# Patient Record
Sex: Female | Born: 1965 | Race: White | Hispanic: Yes | Marital: Married
Health system: Southern US, Community
[De-identification: ages and names within clinical notes are randomized; demographics above are authoritative.]

---

## 2004-01-09 ENCOUNTER — Ambulatory Visit: Payer: Self-pay | Admitting: Internal Medicine

## 2004-01-21 ENCOUNTER — Ambulatory Visit: Payer: Self-pay | Admitting: Internal Medicine

## 2004-01-22 ENCOUNTER — Ambulatory Visit: Payer: Self-pay | Admitting: *Deleted

## 2004-02-05 ENCOUNTER — Ambulatory Visit: Payer: Self-pay | Admitting: Internal Medicine

## 2004-02-18 ENCOUNTER — Ambulatory Visit: Payer: Self-pay | Admitting: Internal Medicine

## 2004-02-26 ENCOUNTER — Ambulatory Visit: Payer: Self-pay | Admitting: Internal Medicine

## 2007-03-15 ENCOUNTER — Encounter (INDEPENDENT_AMBULATORY_CARE_PROVIDER_SITE_OTHER): Payer: Self-pay | Admitting: Family Medicine

## 2007-03-15 ENCOUNTER — Ambulatory Visit: Payer: Self-pay | Admitting: Family Medicine

## 2007-03-15 LAB — CONVERTED CEMR LAB
Antibody Screen: NEGATIVE
Eosinophils Absolute: 0 10*3/uL — ABNORMAL LOW (ref 0.2–0.7)
Hepatitis B Surface Ag: NEGATIVE
Lymphocytes Relative: 18 % (ref 12–46)
Lymphs Abs: 1.2 10*3/uL (ref 0.7–4.0)
MCV: 87.2 fL (ref 78.0–100.0)
Monocytes Relative: 7 % (ref 3–12)
Neutrophils Relative %: 74 % (ref 43–77)
Platelets: 242 10*3/uL (ref 150–400)
RBC: 4.54 M/uL (ref 3.87–5.11)
Rh Type: POSITIVE
Rubella: 301.1 intl units/mL — ABNORMAL HIGH
WBC: 6.9 10*3/uL (ref 4.0–10.5)

## 2007-03-16 ENCOUNTER — Encounter (INDEPENDENT_AMBULATORY_CARE_PROVIDER_SITE_OTHER): Payer: Self-pay | Admitting: Family Medicine

## 2007-03-21 ENCOUNTER — Encounter (INDEPENDENT_AMBULATORY_CARE_PROVIDER_SITE_OTHER): Payer: Self-pay | Admitting: Family Medicine

## 2007-03-21 ENCOUNTER — Encounter: Payer: Self-pay | Admitting: Family Medicine

## 2007-03-21 ENCOUNTER — Ambulatory Visit: Payer: Self-pay | Admitting: Family Medicine

## 2007-04-03 ENCOUNTER — Telehealth (INDEPENDENT_AMBULATORY_CARE_PROVIDER_SITE_OTHER): Payer: Self-pay | Admitting: Family Medicine

## 2007-04-16 ENCOUNTER — Encounter (INDEPENDENT_AMBULATORY_CARE_PROVIDER_SITE_OTHER): Payer: Self-pay | Admitting: Family Medicine

## 2007-05-07 ENCOUNTER — Encounter: Payer: Self-pay | Admitting: Family Medicine

## 2007-05-07 ENCOUNTER — Encounter (INDEPENDENT_AMBULATORY_CARE_PROVIDER_SITE_OTHER): Payer: Self-pay | Admitting: *Deleted

## 2007-05-07 ENCOUNTER — Ambulatory Visit: Payer: Self-pay | Admitting: Family Medicine

## 2007-05-07 DIAGNOSIS — O09529 Supervision of elderly multigravida, unspecified trimester: Secondary | ICD-10-CM | POA: Insufficient documentation

## 2007-05-07 LAB — CONVERTED CEMR LAB
Bilirubin Urine: NEGATIVE
Glucose, Urine, Semiquant: NEGATIVE
Nitrite: NEGATIVE
Protein, U semiquant: 30
Specific Gravity, Urine: 1.015
Whiff Test: NEGATIVE
pH: 8.5

## 2007-05-14 ENCOUNTER — Telehealth: Payer: Self-pay | Admitting: *Deleted

## 2007-05-14 ENCOUNTER — Encounter (INDEPENDENT_AMBULATORY_CARE_PROVIDER_SITE_OTHER): Payer: Self-pay | Admitting: Family Medicine

## 2007-05-15 ENCOUNTER — Telehealth: Payer: Self-pay | Admitting: Family Medicine

## 2007-05-15 ENCOUNTER — Encounter: Payer: Self-pay | Admitting: Family Medicine

## 2007-05-25 ENCOUNTER — Ambulatory Visit (HOSPITAL_COMMUNITY): Admission: RE | Admit: 2007-05-25 | Discharge: 2007-05-25 | Payer: Self-pay | Admitting: Family Medicine

## 2007-05-25 ENCOUNTER — Encounter (INDEPENDENT_AMBULATORY_CARE_PROVIDER_SITE_OTHER): Payer: Self-pay | Admitting: Family Medicine

## 2007-05-30 ENCOUNTER — Ambulatory Visit: Payer: Self-pay | Admitting: Family Medicine

## 2007-05-30 ENCOUNTER — Encounter: Payer: Self-pay | Admitting: Family Medicine

## 2007-06-06 ENCOUNTER — Encounter: Payer: Self-pay | Admitting: Family Medicine

## 2007-06-06 ENCOUNTER — Ambulatory Visit: Payer: Self-pay | Admitting: Family Medicine

## 2007-06-06 LAB — CONVERTED CEMR LAB: Glucose, Urine, Semiquant: NEGATIVE

## 2007-07-03 ENCOUNTER — Ambulatory Visit: Payer: Self-pay | Admitting: Family Medicine

## 2007-07-03 LAB — CONVERTED CEMR LAB

## 2007-07-18 ENCOUNTER — Encounter (INDEPENDENT_AMBULATORY_CARE_PROVIDER_SITE_OTHER): Payer: Self-pay | Admitting: Family Medicine

## 2007-07-18 ENCOUNTER — Ambulatory Visit: Payer: Self-pay | Admitting: Family Medicine

## 2007-07-18 LAB — CONVERTED CEMR LAB: Protein, U semiquant: NEGATIVE

## 2007-07-26 DIAGNOSIS — D649 Anemia, unspecified: Secondary | ICD-10-CM

## 2007-08-07 ENCOUNTER — Ambulatory Visit: Payer: Self-pay | Admitting: Family Medicine

## 2007-08-07 ENCOUNTER — Encounter (INDEPENDENT_AMBULATORY_CARE_PROVIDER_SITE_OTHER): Payer: Self-pay | Admitting: Family Medicine

## 2007-08-07 LAB — CONVERTED CEMR LAB
Bilirubin Urine: NEGATIVE
Glucose, Urine, Semiquant: NEGATIVE
Ketones, urine, test strip: NEGATIVE
Specific Gravity, Urine: 1.025
Urobilinogen, UA: 0.2

## 2007-08-08 ENCOUNTER — Encounter (INDEPENDENT_AMBULATORY_CARE_PROVIDER_SITE_OTHER): Payer: Self-pay | Admitting: Family Medicine

## 2007-08-23 ENCOUNTER — Ambulatory Visit: Payer: Self-pay | Admitting: Family Medicine

## 2007-08-23 ENCOUNTER — Encounter: Payer: Self-pay | Admitting: Family Medicine

## 2007-08-23 LAB — CONVERTED CEMR LAB
Glucose, Urine, Semiquant: NEGATIVE
Protein, U semiquant: NEGATIVE

## 2007-09-06 ENCOUNTER — Encounter: Payer: Self-pay | Admitting: Family Medicine

## 2007-09-06 ENCOUNTER — Ambulatory Visit: Payer: Self-pay | Admitting: Family Medicine

## 2007-09-06 LAB — CONVERTED CEMR LAB: Strep B culture: NEGATIVE

## 2007-09-18 ENCOUNTER — Ambulatory Visit: Payer: Self-pay | Admitting: Family Medicine

## 2007-09-27 ENCOUNTER — Ambulatory Visit: Payer: Self-pay | Admitting: Family Medicine

## 2007-09-28 ENCOUNTER — Encounter: Payer: Self-pay | Admitting: Family Medicine

## 2007-10-05 ENCOUNTER — Encounter: Payer: Self-pay | Admitting: Family Medicine

## 2007-10-05 ENCOUNTER — Ambulatory Visit: Payer: Self-pay | Admitting: Family Medicine

## 2007-10-05 LAB — CONVERTED CEMR LAB

## 2007-10-09 ENCOUNTER — Ambulatory Visit: Payer: Self-pay | Admitting: Family Medicine

## 2007-10-09 ENCOUNTER — Encounter: Payer: Self-pay | Admitting: Family Medicine

## 2007-10-09 LAB — CONVERTED CEMR LAB: Glucose, Urine, Semiquant: NEGATIVE

## 2007-10-14 ENCOUNTER — Ambulatory Visit: Payer: Self-pay | Admitting: Obstetrics & Gynecology

## 2007-10-14 ENCOUNTER — Inpatient Hospital Stay (HOSPITAL_COMMUNITY): Admission: AD | Admit: 2007-10-14 | Discharge: 2007-10-18 | Payer: Self-pay | Admitting: Gynecology

## 2007-10-15 ENCOUNTER — Encounter: Payer: Self-pay | Admitting: Obstetrics & Gynecology

## 2007-10-15 ENCOUNTER — Ambulatory Visit: Payer: Self-pay | Admitting: Family Medicine

## 2007-11-05 ENCOUNTER — Telehealth (INDEPENDENT_AMBULATORY_CARE_PROVIDER_SITE_OTHER): Payer: Self-pay | Admitting: Family Medicine

## 2010-05-02 ENCOUNTER — Encounter: Payer: Self-pay | Admitting: Family Medicine

## 2010-08-24 NOTE — Op Note (Signed)
NAME:  Erin Vargas, Erin Vargas  ACCOUNT NO.:  [id number]   MEDICAL RECORD NO.:  [id number]          PATIENT TYPE:  INP   LOCATION:  9106                          FACILITY:  [facility]   PHYSICIAN:  Norton Blizzard, MD    DATE OF BIRTH:  [birth year]   DATE OF PROCEDURE:  10/15/2007  DATE OF DISCHARGE:                               OPERATIVE REPORT   PREOPERATIVE DIAGNOSES:  1. Intrauterine pregnancy at 41 weeks' gestation.  2. Failed induction of labor for post dates.  3. Fetal malposition.  4. Persistent category 2 fetal heart rate tracing.   POSTOPERATIVE DIAGNOSES:  1. Intrauterine pregnancy at 41 weeks' gestation.  2. Failed induction of labor for post dates.  3. Fetal malposition.  4. Persistent category 2 fetal heart rate tracing.   PROCEDURE:  Primary low transverse cesarean section via Pfannenstiel  incision.   SURGEON:  Norton Blizzard, MD   ASSISTANT:  Alanda Amass, MD   ANESTHESIA:  Epidural.   IV FLUIDS:  1400 mL of lactated Ringer's.   ESTIMATED BLOOD LOSS:  700 mL.   URINE OUTPUT:  75 mL of clear yellow urine.   INDICATIONS:  The patient is a 46 year old G1, P0 who was undergoing  induction of labor for postdates at 34 weeks' gestation.  The patient's  induction was started and she progressed to 5 cm while on Pitocin.  The  patient was noted to have persistent category 2 fetal heart rate tracing  which was characterized by minimal variability at times and repetitive  variable decels.  The patient's position was turned and she was given  oxygen and her tracing did not get better.  At some point, there was the  usage of scalp stimulation for reassurance.  However, the patient was  noted never to have adequate contractions and never changed her cervical  dilatation from 5 cm.  The fetal head was noted to be asynclitic and was  developing significant caput.  Given of these factors, the patient was  given the option to proceed with a cesarean section.  The risks  of this  procedure were discussed with the patient using a Spanish interpreter  and written informed consent was obtained.   FINDINGS:  Viable female infant in cephalic, asynclitic position.  Apgars  were 8 and 9, weight 8 pounds 7 ounces.  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  Spontaneous placental delivery intact with three-  vessel cord.  Normal uterus, tubes, and ovaries bilaterally.   SPECIMENS:  Placenta which was sent to pathology.  Venous cord pH was  7.17.   COMPLICATIONS:  None.   PROCEDURE DETAILS:  The patient was taken to the operating room where  her epidural anesthesia was dosed up to a surgical level and found to be  adequate.  She was then placed in the dorsal supine position with a  leftward tilt, and prepped and draped in a sterile manner.  The fascial  incision was made with the scalpel and carried through to the underlying  layer of fascia.  The fascia was incised in the midline and the incision  was extended bilaterally using the Mayo scissors.  Kochers were  applied  to the superior aspect of the incision, and the rectus muscle was  dissected off bluntly and sharply.  A similar process was carried out on  the inferior aspect of the incision.  The rectus muscles were separated  in the midline bluntly and the peritoneum was entered bluntly.  This  incision was extended with good visualization of bowel and bladder.  Attention was turned to the lower uterine segment where a bladder flap  was created and a transverse hysterotomy was made using the scalpel and  extended in a blunt fashion.  The infant at this point was encountered  and was delivered atraumatically.  The cord was clamped and cut, and the  infant was handed over to the awaiting pediatricians.  Of note, on gross  examination, the infant was noted to have an absent right forearm and  hand.  This was previously noted on anatomy ultrasound scan that was  done at 18 weeks' gestation.  Fundal massage  was then administered, and  placenta delivered intact with three-vessel cord.  The uterus was  exteriorized and cleared of all clot and debris.  The hysterotomy was  repaired using 0 Monocryl in a running locked fashion.  A second layer  of the same suture was used as an imbricating layer.  Overall, good  hemostasis was noted.  The uterus was returned to the abdomen.  The  pelvis was cleared of all clot and debris.  The peritoneum was  reapproximated using two interrupted stitches of 0 Monocryl, the fascia  was reapproximated using 0 Vicryl in a running stitch, and the skin was  closed with staples.  The patient tolerated the procedure well.  Sponge,  instrument, and needle counts were correct x3.  She was taken to the  recovery room awake and in a stable condition.      Norton Blizzard, MD  Electronically Signed     UAD/MEDQ  D:  10/15/2007  T:  10/15/2007  Job:  [id number]

## 2010-08-27 NOTE — Discharge Summary (Signed)
NAME:  Erin Vargas, Erin Vargas  ACCOUNT NO.:  [id number]   MEDICAL RECORD NO.:  [id number]          PATIENT TYPE:  INP   LOCATION:  9106                          FACILITY:  [facility]   PHYSICIAN:  Norton Blizzard, MD    DATE OF BIRTH:  1966/03/21   DATE OF ADMISSION:  10/14/2007  DATE OF DISCHARGE:  10/18/2007                               DISCHARGE SUMMARY   REASON FOR ADMISSION:  Induction of labor.   DISCHARGE DIAGNOSES:  1. Cesarean section due to nonreassuring fetal heart tones and minimal      cervical change.  2. Failed induction of labor for post date.  The patient was 41 weeks      at time of the cesarean section.  3. Fetals malposition.  4. Persistent category II fetal heart rate tracing.  5. Delivery of viable female infant missing the right forearm.   PROCEDURES:  A low-transverse cesarean section on October 15, 2007.   LABORATORY DATA:  RPR nonreactive.  Discharge CBC, hemoglobin 10.1,  white blood cell count 11.4, hematocrit 29.5, platelets 130.  Admission  CBC showed hemoglobin of 11.5, white blood cell count 7.1, platelets  167.  Preeclampsia labs were done.  A CMET showed a slightly elevated  ALP at 156, albumin low at 2.5, total protein low at 5.8, otherwise  normal.  Uric acid was normal at 5.5.   CONDITION ON DISCHARGE:  She is doing well.  The patient is stable with  normal vital signs.  She will be discharged home with her child.   PRENATAL LABORATORY DATA:  RH positive, RPR negative, hepatitis B anti-  surface antigen negative, rubella immune, HIV nonreactive, GBS negative,  gonorrhea and Chlamydia negative.   HOSPITAL COURSE:  The patient is a 45 year old Latino now a G1, P 1-0-0-  1 who was admitted due to elevated blood pressure the night prior to her  induction date. She was supposed to be induced the following day at 41  weeks.  The patient's pregnancy was only significant for anemia and the  absence of a right ulna and radius and hand were noted at  approximately  20 weeks on the anatomy ultrasound.  The patient had good group support  for this since the time of this scan.  The patient had preeclampsia  panel that looked unimpressive and her blood pressures normalized after  recieving an epidural. She was induced with Pitocin and had a  nonreassuring strip many times when pitocin was increased. Due to  minimal change, likely malpositioned infant, and continued episodes of  NRFTS, the decision was made to have a C-section.  A LTCS was done by  Dr. Silas Flood.  An 8 pound 8 ounce viable female infant was delivered with  missing right forearm and hand.  Apgars were 8 and 9.  The patient had a  normal hospital stay after delivery and was able to go home with her  child on postop day #3 without any complications.  She was discharged  home on prenatal vitamins, Colace p.r.n., Percocet p.r.n., and ibuprofen  p.r.n.   [facility] HOSPITAL APPOINTMENTS:  The patient will follow up with the  [facility]  in 6 weeks.  She will need to call for  an appointment.   DISCHARGE MEDICATIONS:  1. Ibuprofen 600 mg take 1 tablet every 6 hours p.r.n. cramping/pain.  2. Percocet 5/325 mg take 1 tablet every 4-6 hours p.r.n. pain.  3. Colace 100 mg take 1 tablet daily p.r.n. constipation.  4. Prenatal vitamins take 1 tablet daily while breast-feeding.      Alanda Amass, M.D.      Norton Blizzard, MD  Electronically Signed    JH/MEDQ  D:  11/01/2007  T:  11/02/2007  Job:  [id number]

## 2011-01-06 LAB — COMPREHENSIVE METABOLIC PANEL
ALT: 25
AST: 27
Albumin: 2.5 — ABNORMAL LOW
Alkaline Phosphatase: 156 — ABNORMAL HIGH
GFR calc Af Amer: >60
Potassium: 3.9
Sodium: 138
Total Protein: 5.8 — ABNORMAL LOW

## 2011-01-06 LAB — CBC
HCT: 37.4
Hemoglobin: 10.1 — ABNORMAL LOW
Hemoglobin: 12.4
MCHC: 33
MCHC: 34
Platelets: 167
RDW: 16.1 — ABNORMAL HIGH
RDW: 16.8 — ABNORMAL HIGH
RDW: 17 — ABNORMAL HIGH

## 2011-01-06 LAB — URIC ACID: Uric Acid, Serum: 5.5

## 2019-01-07 ENCOUNTER — Other Ambulatory Visit (HOSPITAL_COMMUNITY): Payer: Self-pay | Admitting: *Deleted

## 2019-01-07 DIAGNOSIS — Z1231 Encounter for screening mammogram for malignant neoplasm of breast: Secondary | ICD-10-CM

## 2019-03-13 ENCOUNTER — Other Ambulatory Visit: Payer: Self-pay

## 2019-03-13 DIAGNOSIS — Z20822 Contact with and (suspected) exposure to covid-19: Secondary | ICD-10-CM

## 2019-03-14 ENCOUNTER — Ambulatory Visit (HOSPITAL_COMMUNITY): Payer: Self-pay

## 2019-03-15 LAB — NOVEL CORONAVIRUS, NAA: SARS-CoV-2, NAA: DETECTED — AB

## 2019-05-02 ENCOUNTER — Encounter (HOSPITAL_COMMUNITY): Payer: Self-pay

## 2019-05-02 ENCOUNTER — Ambulatory Visit
Admission: RE | Admit: 2019-05-02 | Discharge: 2019-05-02 | Disposition: A | Discharge status: Home / Self Care | Payer: No Typology Code available for payment source | Source: Ambulatory Visit | Attending: Obstetrics and Gynecology | Admitting: Obstetrics and Gynecology

## 2019-05-02 ENCOUNTER — Ambulatory Visit (HOSPITAL_COMMUNITY)
Admission: RE | Admit: 2019-05-02 | Discharge: 2019-05-02 | Disposition: A | Discharge status: Home / Self Care | Payer: Self-pay | Source: Ambulatory Visit | Attending: Obstetrics and Gynecology | Admitting: Obstetrics and Gynecology

## 2019-05-02 ENCOUNTER — Other Ambulatory Visit: Payer: Self-pay

## 2019-05-02 DIAGNOSIS — Z01419 Encounter for gynecological examination (general) (routine) without abnormal findings: Secondary | ICD-10-CM | POA: Insufficient documentation

## 2019-05-02 DIAGNOSIS — Z1231 Encounter for screening mammogram for malignant neoplasm of breast: Secondary | ICD-10-CM

## 2019-05-02 NOTE — Progress Notes (Signed)
No complaints today.   Pap Smear: Pap smear completed today. Last Pap smear was 03/21/2007 and normal with fungus. Per patient has no history of an abnormal Pap smear. Last Pap smear result is in Epic.  Physical exam: Breasts Breasts symmetrical. No skin abnormalities bilateral breasts. No nipple retraction bilateral breasts. No nipple discharge bilateral breasts. No lymphadenopathy. No lumps palpated bilateral breasts. No complaints of pain or tenderness on exam. Referred patient to the [facility] for a screening mammogram. Appointment scheduled for Thursday, May 02, 2019 at 1510.        Pelvic/Bimanual   Ext Genitalia No lesions, no swelling and no discharge observed on external genitalia.         Vagina Vagina pink and normal texture. No lesions or discharge observed in vagina.          Cervix Cervix is present. Cervix pink and of normal texture. No discharge observed.     Uterus Uterus is present and palpable. Uterus in normal position and normal size.        Adnexae Bilateral ovaries present and palpable. No tenderness on palpation.         Rectovaginal No rectal exam completed today since patient had no rectal complaints. No skin abnormalities observed on exam.    Smoking History: Patient has never smoked.  Patient Navigation: Patient education provided. Access to services provided for patient through [facility] program. Spanish interpreter provided.   Colorectal Cancer Screening: Per patient has never had a colonoscopy completed. No complaints today.   Breast and Cervical Cancer Risk Assessment: Patient has no family history of breast cancer, known genetic mutations, or radiation treatment to the chest before age 29. Patient has no history of cervical dysplasia, immunocompromised, or DES exposure in-utero.  Risk Assessment    Risk Scores      05/02/2019   Last edited by: Narda Rutherford, LPN   5-year risk: 1 %   Lifetime risk: 8.3 %          Used Spanish interpreter Natale Lay from [place].

## 2019-05-02 NOTE — Patient Instructions (Signed)
Explained breast self awareness with Letitia Caul. Let patient know [facility] will cover Pap smears and HPV typing every 5 years unless has a history of abnormal Pap smears. Referred patient to the [facility] for a screening mammogram. Appointment scheduled for Thursday, May 02, 2019 at 1510. Patient aware of appointment and will be there. Let patient know will follow up with her within the next couple weeks with results of Pap smear by letter or phone. Informed patient that the Breast Center will follow-up with her within the next couple of weeks with results of her mammogram by letter or phone. Letitia Caul verbalized understanding.  Liesl Simons, Kathaleen Maser, RN 1:25 PM

## 2019-05-07 LAB — CYTOLOGY - PAP
Comment: NEGATIVE
Diagnosis: NEGATIVE
High risk HPV: NEGATIVE

## 2019-05-13 ENCOUNTER — Telehealth (HOSPITAL_COMMUNITY): Payer: Self-pay | Admitting: *Deleted

## 2019-05-13 NOTE — Telephone Encounter (Signed)
Called patient with Spanish interpreter Julia Sowell to discuss Pap smear results. Let patient know that her Pap smear was normal and HPV negative. Informed patient that her next Pap smear is due in 5 years. Patient verbalized understanding. 

## 2020-02-11 IMAGING — MG DIGITAL SCREENING BILAT W/ TOMO W/ CAD
8 series · 8 of 24 positions shown · non-contrast
Comparison: Previous exam(s).

CLINICAL DATA: Screening.

EXAM:
DIGITAL SCREENING BILATERAL MAMMOGRAM WITH TOMO AND CAD

[L CC synth-2D]
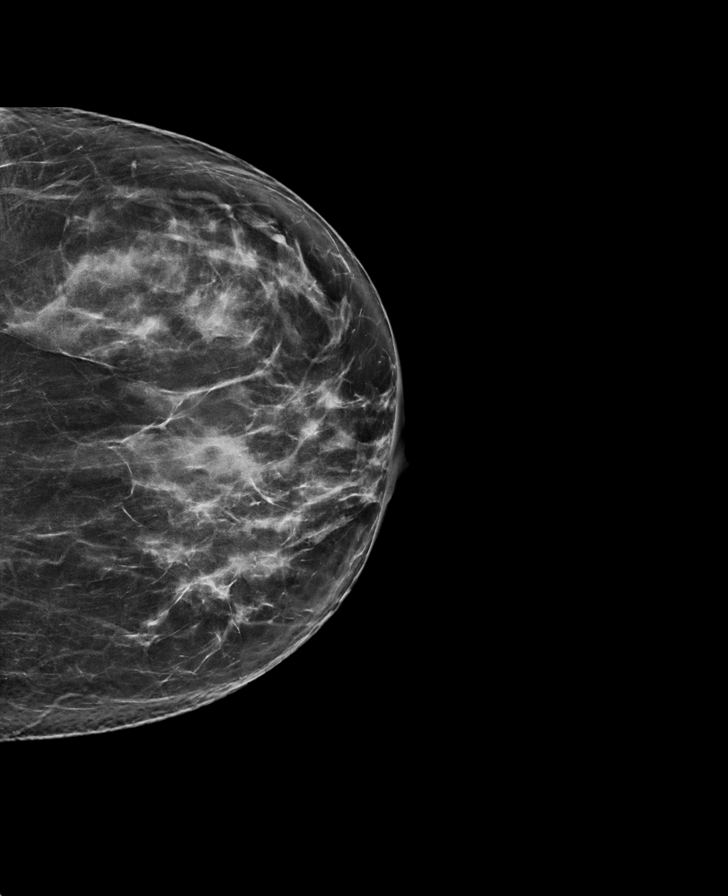

[R CC synth-2D]
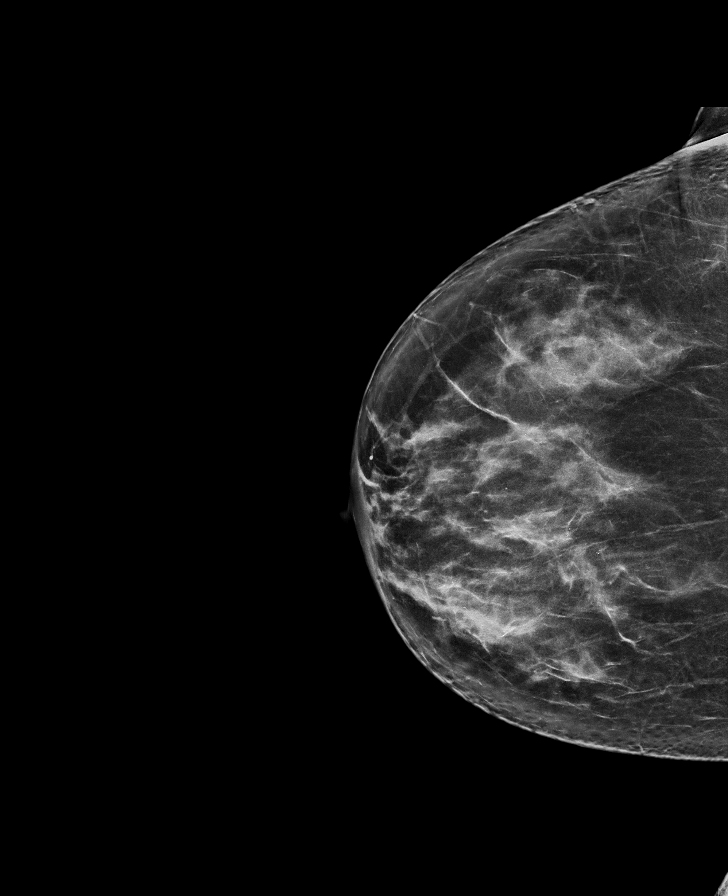

[R MLO synth-2D]
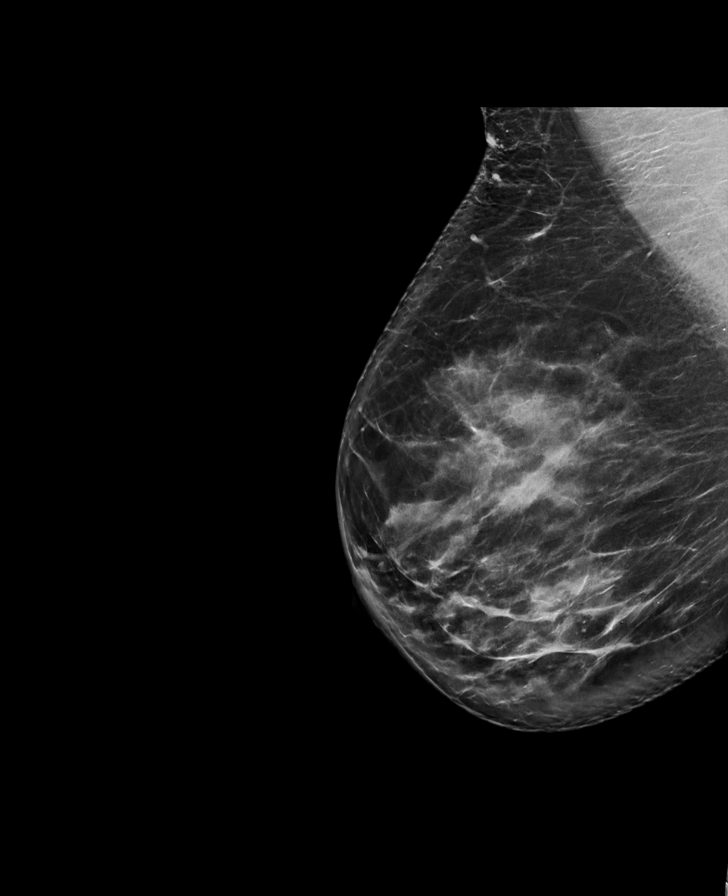

[L MLO synth-2D]
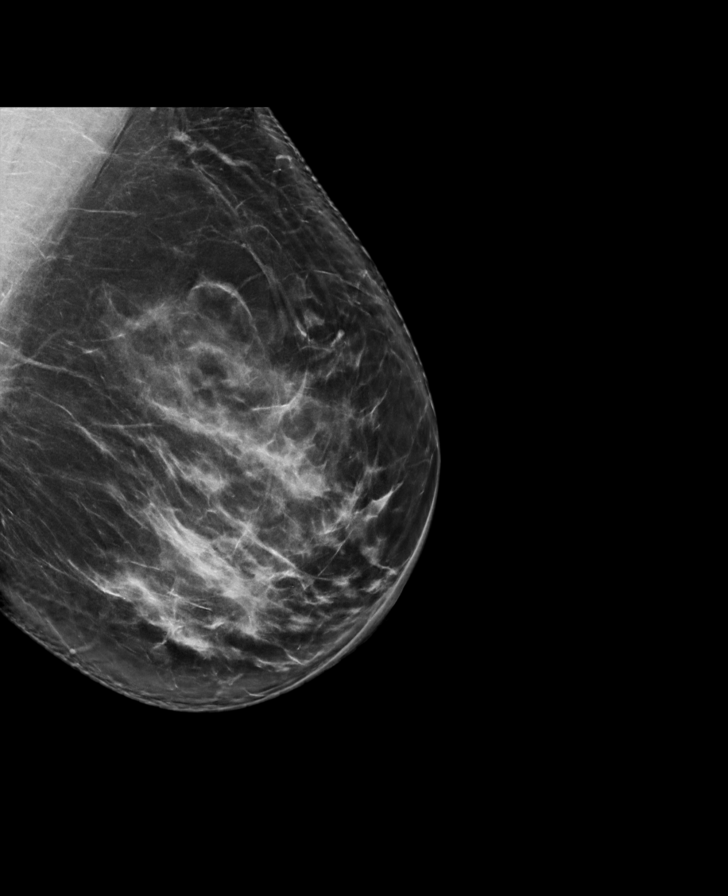

[L MLO tomo · tomo slice 47/93.0]
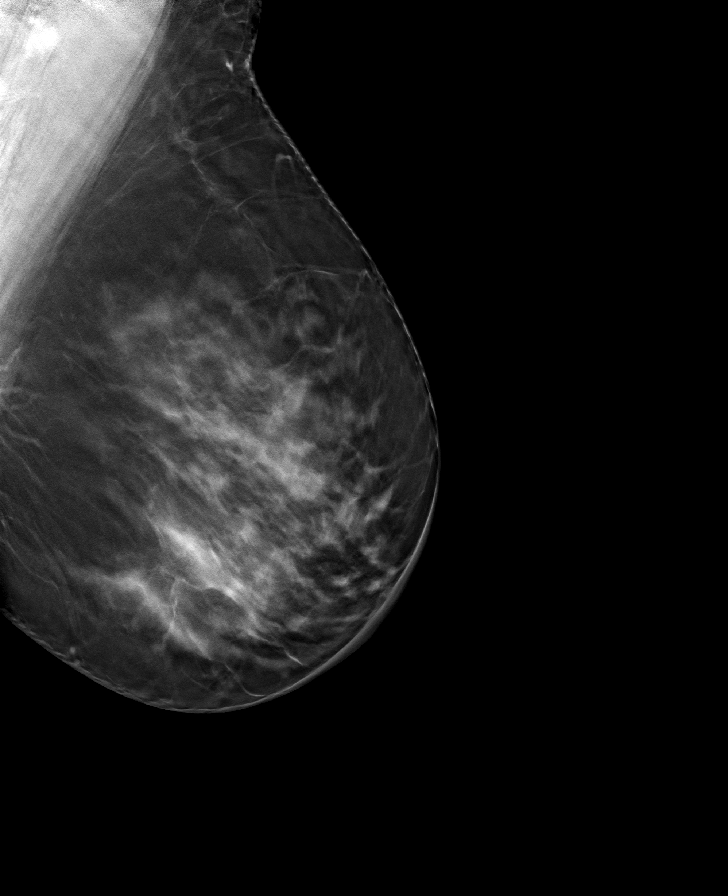

[R MLO tomo · tomo slice 45/90.0]
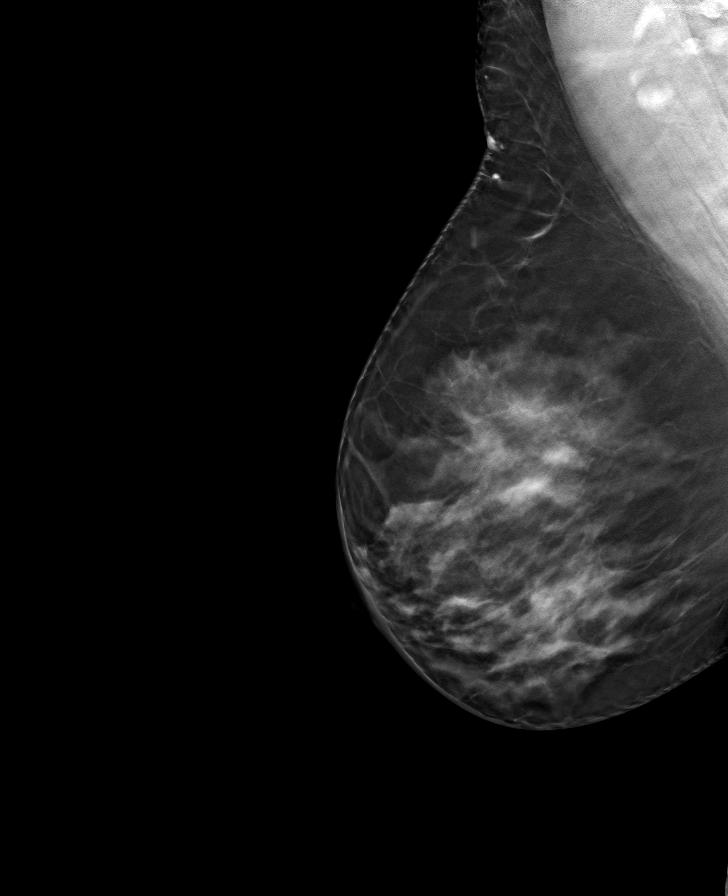

[L CC tomo · tomo slice 41/80.0]
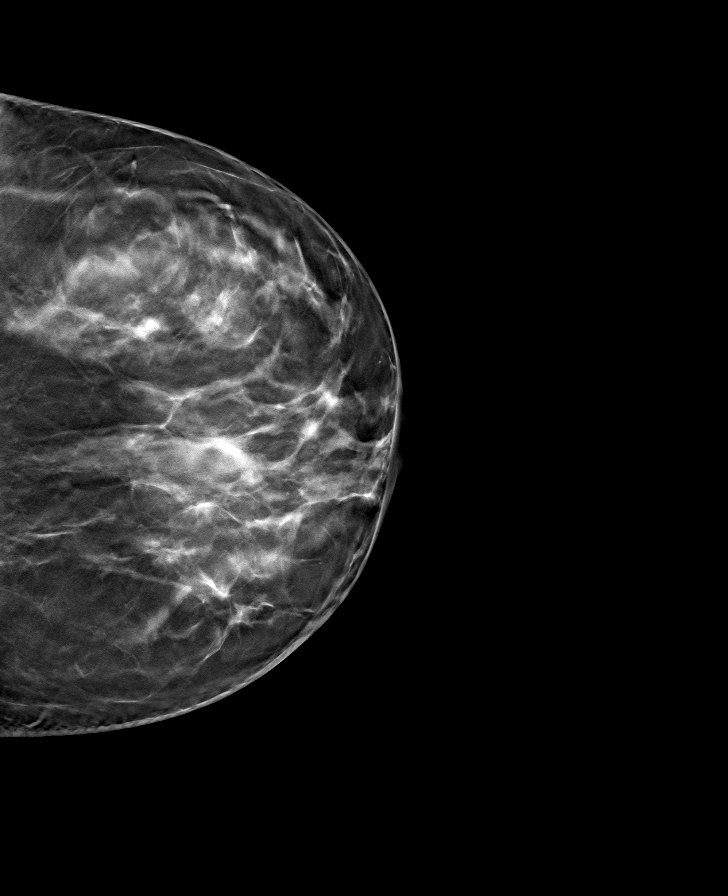

[R CC tomo · tomo slice 42/83.0]
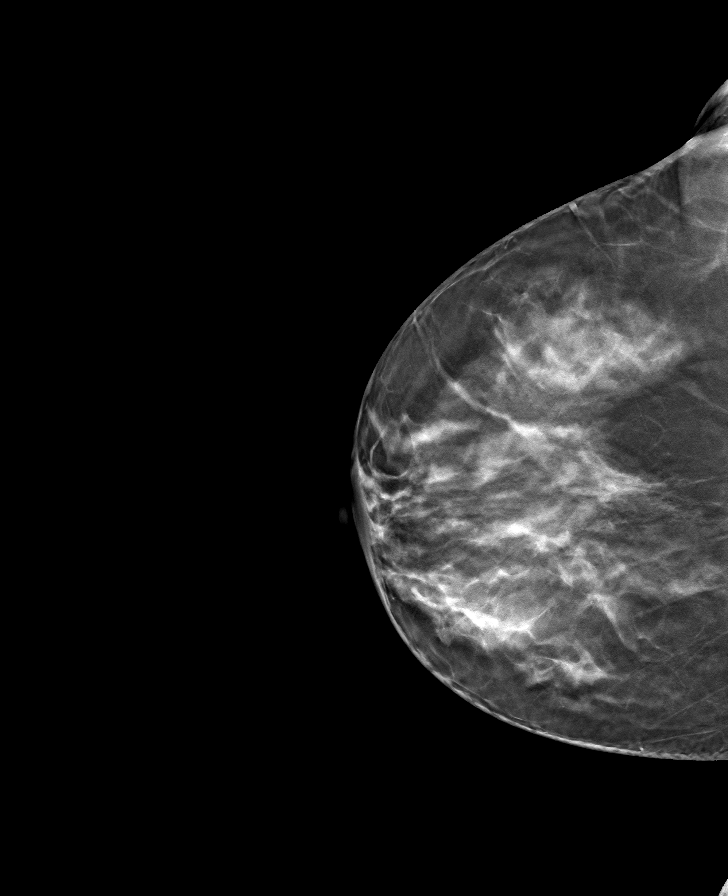

[8 of 24 positions shown; findings below may reference images not displayed]

ACR Breast Density Category c: The breast tissue is heterogeneously
dense, which may obscure small masses.
FINDINGS: There are no findings suspicious for malignancy. Images were
processed with CAD.
IMPRESSION: No mammographic evidence of malignancy. A result letter of this
screening mammogram will be mailed directly to the patient.

RECOMMENDATION:
Screening mammogram in one year. (Code:FT-U-LHB)

BI-RADS CATEGORY  1: Negative.
